# Patient Record
Sex: Female | Born: 1970 | Race: White | Hispanic: No | Marital: Married | Smoking: Current every day smoker
Health system: Southern US, Community
[De-identification: ages and names within clinical notes are randomized; demographics above are authoritative.]

## PROBLEM LIST (undated history)

## (undated) DIAGNOSIS — Z803 Family history of malignant neoplasm of breast: Secondary | ICD-10-CM

## (undated) DIAGNOSIS — Z22322 Carrier or suspected carrier of Methicillin resistant Staphylococcus aureus: Secondary | ICD-10-CM

## (undated) HISTORY — DX: Carrier or suspected carrier of methicillin resistant Staphylococcus aureus: Z22.322

## (undated) HISTORY — DX: Family history of malignant neoplasm of breast: Z80.3

---

## 1998-03-04 HISTORY — PX: ECTOPIC PREGNANCY SURGERY: SHX613

## 1999-04-05 HISTORY — PX: TUBAL LIGATION: SHX77

## 2009-07-10 ENCOUNTER — Ambulatory Visit: Payer: Self-pay | Admitting: Family Medicine

## 2010-04-04 DIAGNOSIS — Z22322 Carrier or suspected carrier of Methicillin resistant Staphylococcus aureus: Secondary | ICD-10-CM

## 2010-04-04 HISTORY — DX: Carrier or suspected carrier of methicillin resistant Staphylococcus aureus: Z22.322

## 2011-05-03 ENCOUNTER — Ambulatory Visit: Payer: Self-pay | Admitting: Family Medicine

## 2013-02-06 ENCOUNTER — Ambulatory Visit: Payer: Self-pay | Admitting: Family Medicine

## 2013-02-13 ENCOUNTER — Ambulatory Visit: Payer: Self-pay | Admitting: Family Medicine

## 2013-02-14 ENCOUNTER — Encounter: Payer: Self-pay | Admitting: *Deleted

## 2013-03-07 ENCOUNTER — Ambulatory Visit (INDEPENDENT_AMBULATORY_CARE_PROVIDER_SITE_OTHER): Payer: BC Managed Care – PPO | Admitting: General Surgery

## 2013-03-07 ENCOUNTER — Encounter: Payer: Self-pay | Admitting: General Surgery

## 2013-03-07 VITALS — BP 126/74 | HR 76 | Resp 12 | Ht 64.0 in | Wt 196.0 lb

## 2013-03-07 DIAGNOSIS — R920 Mammographic microcalcification found on diagnostic imaging of breast: Secondary | ICD-10-CM

## 2013-03-07 NOTE — Patient Instructions (Addendum)
Follow up on 6 months with left breast diagnostic mammogram and office visit.  Continue self breast exams. Call office for any new breast issues or concerns.

## 2013-03-07 NOTE — Progress Notes (Signed)
Patient ID: Mary Lopez, female   DOB: 01/24/1971, 42 y.o.   MRN: [id number]  Chief Complaint  Patient presents with  . Breast Problem    HPI Mary Lopez is a 42 y.o. female  Who was referred to [place] by Dr Carlynn Purl for an abnormal mammogram. The most recent mammogram was done on 02/13/13 @ [facility].  Patient does perform regular self breast checks and her last regular mammogram was 2012.  She states she can't feel anything abnormal in the breast.    HPI  Past Medical History  Diagnosis Date  . 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 Ectopic pregnancy surgery  03/1998  . Tubal ligation  04/1999    Family History  Problem Relation Age of Onset  . Breast cancer Maternal Aunt   . Breast cancer Maternal Aunt   . Breast cancer Cousin     Social History History  Substance Use Topics  . Smoking status: Current Every Day Smoker -- 0.50 packs/day for 13 years    Types: Cigarettes  . Smokeless tobacco: Never Used  . Alcohol Use: No    Allergies  Allergen Reactions  . Penicillins     Mouth breaks out  . Sulfa Antibiotics Other (See Comments)    Muscle pain    No current outpatient prescriptions on file.   No current facility-administered medications for this visit.    Review of Systems Review of Systems  Constitutional: Negative.   Respiratory: Negative.   Cardiovascular: Negative.     Blood pressure 126/74, pulse 76, resp. rate 12, height 5\' 4"  (1.626 m), weight 196 lb (88.905 kg), last menstrual period 03/07/2013.  Physical Exam Physical Exam  Constitutional: She is oriented to person, place, and time. She appears well-developed and well-nourished.  Eyes: Conjunctivae are normal. No scleral icterus.  Neck: Neck supple.  Cardiovascular: Normal rate, regular rhythm and normal heart sounds.   Pulmonary/Chest: Effort normal and breath sounds normal. Right breast exhibits no inverted nipple, no mass, no nipple discharge, no skin  change and no tenderness. Left breast exhibits no inverted nipple, no mass, no nipple discharge, no skin change and no tenderness.  Lymphadenopathy:    She has no cervical adenopathy.    She has no axillary adenopathy.  Neurological: She is alert and oriented to person, place, and time.  Skin: Skin is warm and dry.    Data Reviewed Mammogram reviewed. There is group of 3-4 microcalcifications lateral left breast, as also some scattered calcifications elsewhere.  21mo f/u of left breast is reasonable.  Discussed fully with pt. Pt is satisfied with the plan.  Assessment    Normal breast evaluation. Abnormal mammogram.    Plan    Follow up on 6 months with left breast diagnostic mammogram and office visit.        Elly Haffey G 03/07/2013, 12:01 PM

## 2013-09-30 ENCOUNTER — Encounter: Payer: Self-pay | Admitting: General Surgery

## 2013-10-02 ENCOUNTER — Ambulatory Visit: Payer: BC Managed Care – PPO | Admitting: General Surgery

## 2013-10-14 ENCOUNTER — Ambulatory Visit: Payer: Self-pay | Admitting: Obstetrics and Gynecology

## 2013-10-14 LAB — CBC
HCT: 37 % (ref 35.0–47.0)
HGB: 12 g/dL (ref 12.0–16.0)
MCH: 30 pg (ref 26.0–34.0)
MCHC: 32.5 g/dL (ref 32.0–36.0)
MCV: 92 fL (ref 80–100)
Platelet: 281 10*3/uL (ref 150–440)
RBC: 4 10*6/uL (ref 3.80–5.20)
RDW: 14 % (ref 11.5–14.5)
WBC: 9.5 10*3/uL (ref 3.6–11.0)

## 2013-10-22 ENCOUNTER — Ambulatory Visit: Payer: Self-pay | Admitting: Obstetrics and Gynecology

## 2013-10-28 LAB — PATHOLOGY REPORT

## 2013-10-30 ENCOUNTER — Ambulatory Visit: Payer: BC Managed Care – PPO | Admitting: General Surgery

## 2013-11-26 ENCOUNTER — Encounter: Payer: Self-pay | Admitting: *Deleted

## 2014-02-03 ENCOUNTER — Encounter: Payer: Self-pay | Admitting: General Surgery

## 2014-07-26 NOTE — Op Note (Signed)
PATIENT NAME:  Mary Lopez, Mary Lopez MR#:  [id number] DATE OF BIRTH:  [birth year]  DATE OF PROCEDURE:  10/22/2013  PREOPERATIVE DIAGNOSIS: Menorrhagia.   POSTOPERATIVE DIAGNOSIS: Menorrhagia and small anterior endometrial polyp.   OPERATION PERFORMED: Hysteroscopy, dilatation and curettage, and NovaSure endometrial ablation.   PRIMARY SURGEON: Vena AustriaAndreas Brion Sossamon, MD  ANESTHESIA USED: General via LMA.  PREOPERATIVE ANTIBIOTICS: None.   DRAINS OR TUBES: None.   ESTIMATED BLOOD LOSS: Minimal.   OPERATIVE FLUIDS: 700 mL.   URINE OUTPUT: 30 mL of clear urine.   COMPLICATIONS: None.   FINDINGS: Normal cervix and endometrial cavity, other than a small 3 mm uterine anterior lower segment polyp, which was removed during the sharp curettage portion of the case. Post ablation hysteroscopy revealed good coverage of the entire cavity with sparing of the endocervix.   SPECIMENS REMOVED: Endometrial curettings.   PATIENT CONDITION FOLLOWING PROCEDURE: Stable.   PROCEDURE IN DETAIL: Risks, benefits, and alternatives of the procedure were discussed with the patient prior to proceeding to the operating room. The patient was taken to the operating room where she was placed under general anesthesia using LMA airway. She was positioned in the dorsal lithotomy position utilizing Allen stirrups, prepped and draped in the usual sterile fashion. A timeout was performed. Attention was turned to the patient's pelvis. The bladder was drained using a red rubber catheter for 30 mL of clear urine. An operative speculum was then placed visualizing the anterior lip of the cervix, which was grasped with a single-tooth tenaculum. The cervix was sequentially dilated using Pratt dilators to allow passage of the hysteroscope. Hysteroscopic evaluation of the cavity revealed a grossly normal-appearing cavity with the exception of a small 3 mm anterior lower uterine segment polyp. This was removed during sharp curettage, which was  undertaken following the hysteroscopy using a serrated small curette. A hysteroscopy of the cavity following the curettage revealed complete removal of the endometrial polyp. The uterus sounded to 9.5 cm with a cervical length of 4 cm yielding a cavity length of 5.5 cm.  The NovaSure device was placed within the endometrial cavity and deployed yielding a cavity width of 4.5 cm. The device was activated and passed cavity assessment with total burn time of 1 minute 42 seconds. Following the ablation, the NovaSure device was removed. A second look hysteroscopy revealed good coverage of the endometrial cavity with sparing of the endocervix. The single-tooth tenaculum was removed. The tenaculum sites were noted to be hemostatic as was the cervix. Sponge, needle, and instrument counts were correct x2. The patient was taken to the recovery room in stable condition.   ____________________________ Florina OuAndreas M. Bonney AidStaebler, MD ams:sb D: 10/24/2013 12:14:15 ET T: 10/24/2013 12:25:09 ET JOB#: [id number]  cc: Florina OuAndreas M. Bonney AidStaebler, MD, <Dictator> Carmel SacramentoANDREAS Cathrine MusterM Baya Lentz MD ELECTRONICALLY SIGNED 11/14/2013 11:39

## 2015-04-08 ENCOUNTER — Telehealth: Payer: Self-pay

## 2015-04-08 NOTE — Telephone Encounter (Signed)
Tried to contact this patient to see if she was interested in getting her influenza vaccine, but there was no answer. A message was left for her to give us a call when she got the chance.  

## 2015-07-31 IMAGING — MG MM DIGITAL SCREENING BILAT W/ CAD
1 series · 4 of 4 positions shown · non-contrast
Comparison: Previous Exam(s)

CLINICAL DATA: Screening.

EXAM:
DIGITAL SCREENING BILATERAL MAMMOGRAM WITH CAD

[R CC · right · 4 of 4 slices shown]
[im 1/4]
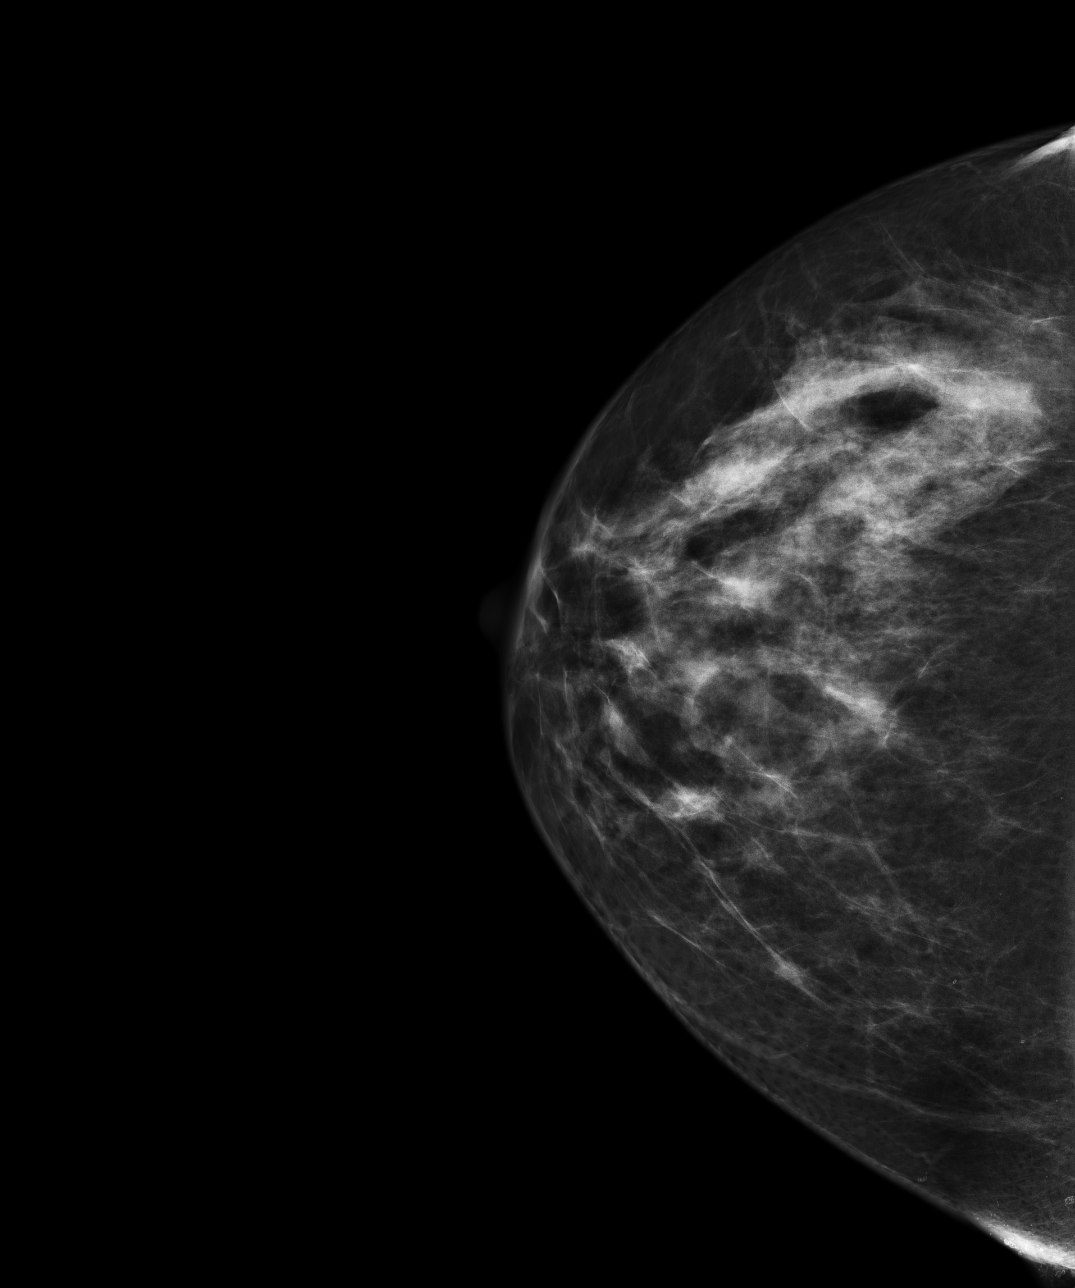
[im 2/4]
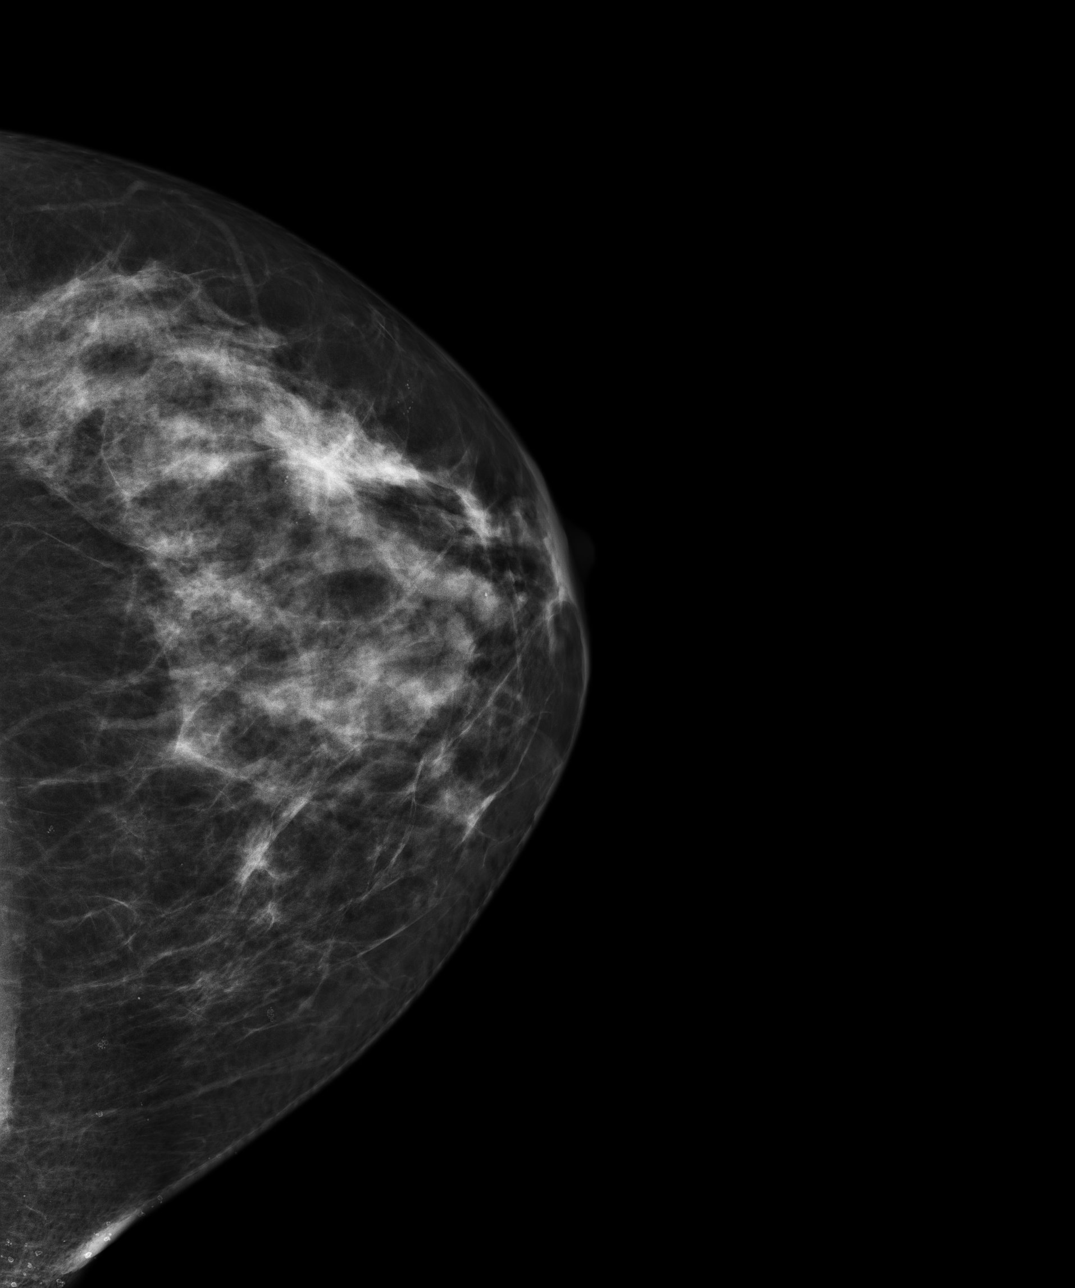
[im 3/4]
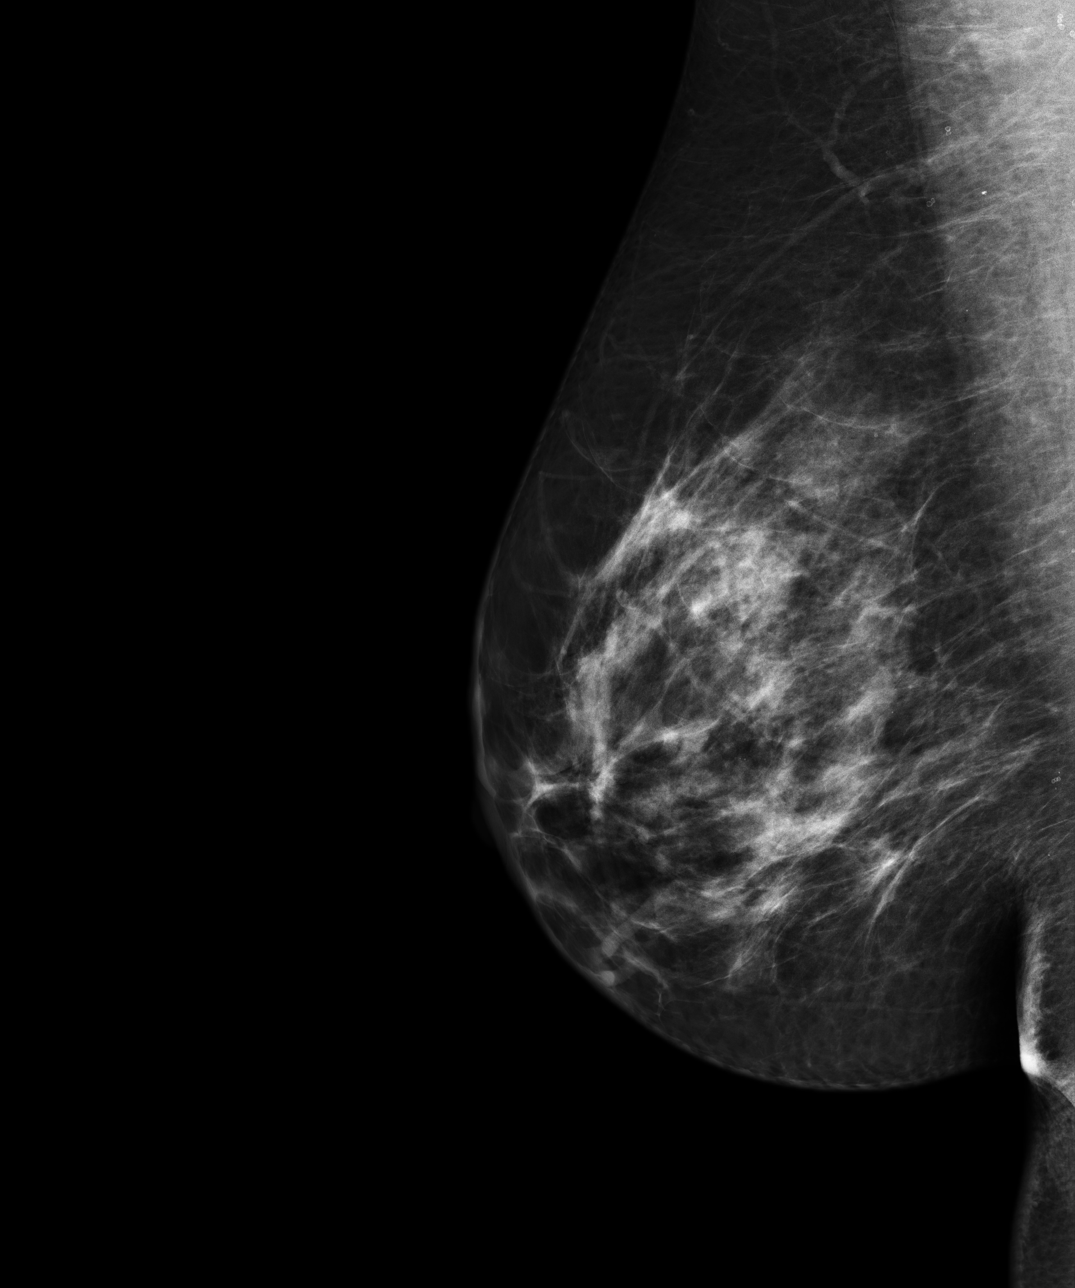
[im 4/4]
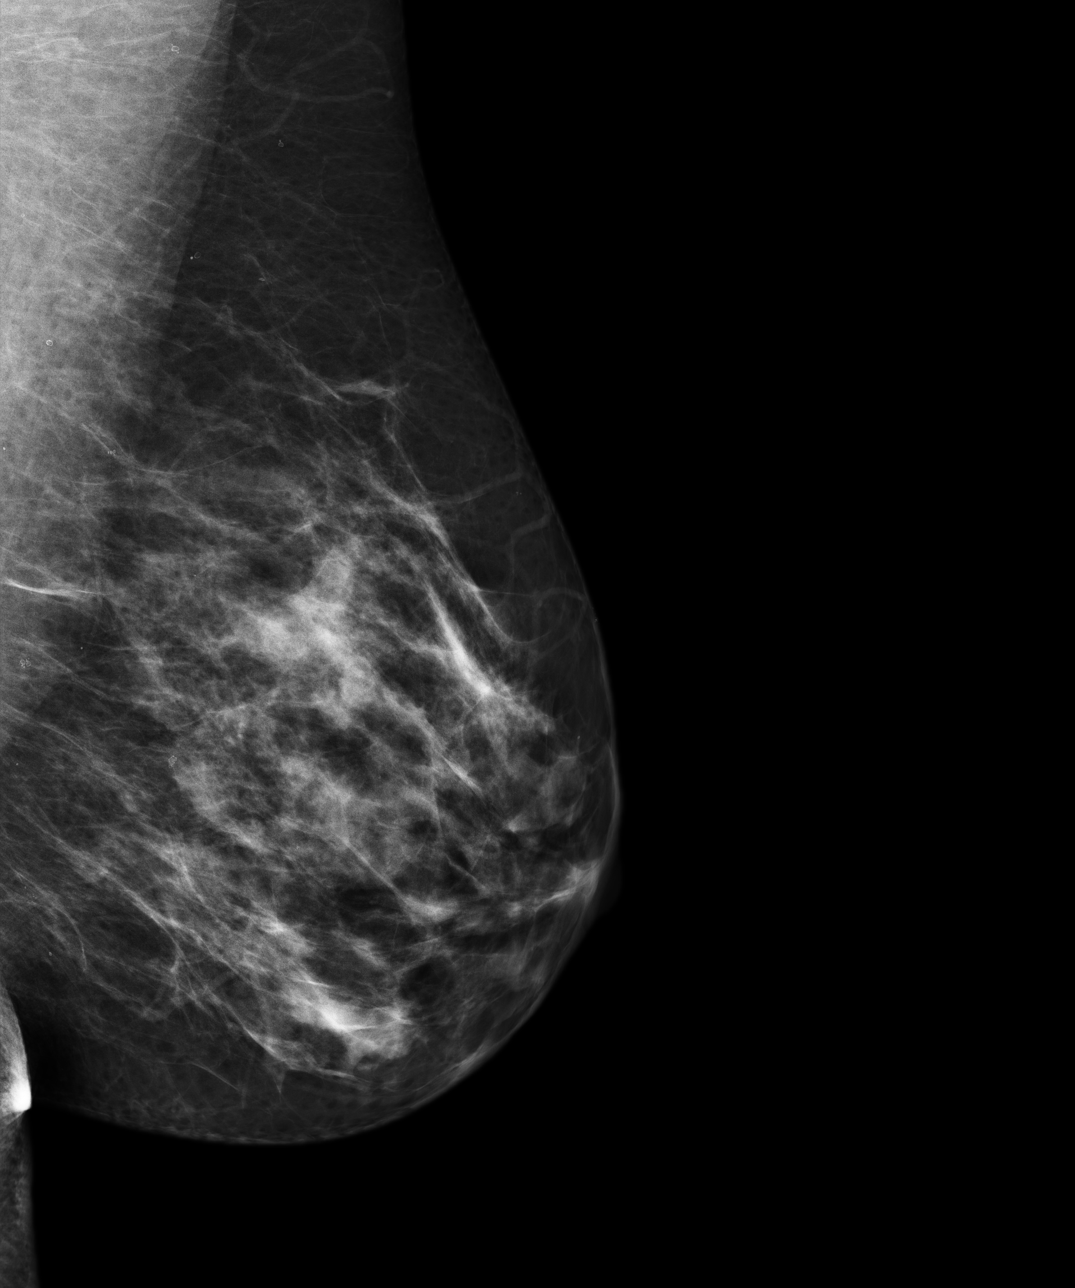

[4 of 4 positions shown; findings below may reference images not displayed]

ACR Breast Density Category c: The breasts are heterogeneously
dense, which may obscure small masses.
FINDINGS: In the left breast, calcifications warrant further evaluation with
magnified views. In the right breast, no mass or malignant type
calcifications are identified. Images were processed with CAD.
IMPRESSION: Further evaluation is suggested for calcifications in the left
breast.

RECOMMENDATION:
Diagnostic mammogram of the left breast. (Code:P4-X-JJJ)

The patient will be contacted regarding the findings, and additional
imaging will be scheduled.

BI-RADS CATEGORY  0. Incomplete. Need additional imaging evaluation
and/or prior mammograms for comparison.

## 2016-07-04 ENCOUNTER — Ambulatory Visit (INDEPENDENT_AMBULATORY_CARE_PROVIDER_SITE_OTHER): Payer: Self-pay | Admitting: Obstetrics and Gynecology

## 2016-07-04 ENCOUNTER — Encounter: Payer: Self-pay | Admitting: Obstetrics and Gynecology

## 2016-07-04 VITALS — BP 126/76 | HR 83 | Ht 64.0 in | Wt 193.0 lb

## 2016-07-04 DIAGNOSIS — Z131 Encounter for screening for diabetes mellitus: Secondary | ICD-10-CM

## 2016-07-04 DIAGNOSIS — Z13220 Encounter for screening for lipoid disorders: Secondary | ICD-10-CM

## 2016-07-04 DIAGNOSIS — Z01419 Encounter for gynecological examination (general) (routine) without abnormal findings: Secondary | ICD-10-CM

## 2016-07-04 DIAGNOSIS — Z1371 Encounter for nonprocreative screening for genetic disease carrier status: Secondary | ICD-10-CM | POA: Insufficient documentation

## 2016-07-04 DIAGNOSIS — Z1231 Encounter for screening mammogram for malignant neoplasm of breast: Secondary | ICD-10-CM

## 2016-07-04 DIAGNOSIS — F324 Major depressive disorder, single episode, in partial remission: Secondary | ICD-10-CM

## 2016-07-04 DIAGNOSIS — Z1329 Encounter for screening for other suspected endocrine disorder: Secondary | ICD-10-CM

## 2016-07-04 DIAGNOSIS — Z1239 Encounter for other screening for malignant neoplasm of breast: Secondary | ICD-10-CM

## 2016-07-04 MED ORDER — FLUOXETINE HCL 20 MG PO CAPS: 20.0000 mg | ORAL_CAPSULE | Freq: Every day | ORAL | 2 refills | Status: DC

## 2016-07-04 NOTE — Patient Instructions (Signed)
See my chart sign up info

## 2016-07-04 NOTE — Progress Notes (Signed)
Patient ID: Mary Lopez, female   DOB: 07/11/1970, 45 y.o.   MRN: [id number]     Gynecology Annual Exam  PCP: Krichna F Sowles, MD  Chief Complaint:  Chief Complaint  Patient presents with  . Gynecologic Exam    change medication  . vaginal irritation    left labia sore    History of Present Illness: Patient is a 45 y.o. G4P0013 presents for annual exam. The patient has no complaints today.   LMP: No LMP recorded. Patient has had an ablation. No bleeding since ablation   The patient is not sexually active. She currently uses None for contraception. She denies and does not dyspareunia.  The patient does perform self breast exams.  There is notable family history of breast or ovarian cancer in her family.  The patient wears seatbelts: yes.   The patient has regular exercise: not asked.    The patient denies current symptoms of depression.    Review of Systems: ROS  Past Medical History:  Past Medical History:  Diagnosis Date  . 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 ECTOPIC PREGNANCY SURGERY  03/1998  . TUBAL LIGATION  04/1999    Gynecologic History:  No LMP recorded. Patient has had an ablation. Contraception: abstinence Last Pap: Results were: NIL and HR HPV negative  Last mammogram: 1 year ago normal Obstetric History: G4P0013  Family History:  Family History  Problem Relation Age of Onset  . Breast cancer Maternal Aunt 50  . Breast cancer Maternal Aunt 60  . Breast cancer Cousin 40    Social History:  Social History   Social History  . Marital status: Married    Spouse name: N/A  . Number of children: N/A  . Years of education: N/A   Occupational History  . Not on file.   Social History Main Topics  . Smoking status: Current Every Day Smoker    Packs/day: 0.50    Years: 13.00    Types: Cigarettes  . Smokeless tobacco: Never Used  . Alcohol use No  . Drug use: No  . Sexual  activity: No   Other Topics Concern  . Not on file   Social History Narrative  . No narrative on file    Allergies:  Allergies  Allergen Reactions  . Penicillins     Mouth breaks out  . Sulfa Antibiotics Other (See Comments)    Muscle pain    Medications: Prior to Admission medications   Medication Sig Start Date End Date Taking? Authorizing Provider  escitalopram (LEXAPRO) 20 MG tablet Take 20 mg by mouth daily. 05/18/16   Historical Provider, MD    Physical Exam Vitals: Blood pressure 126/76, pulse 83, height 5' 4" (1.626 m), weight 193 lb (87.5 kg).  General: NAD HEENT: normocephalic, anicteric Thyroid: no enlargement, no palpable nodules Pulmonary: No increased work of breathing, CTAB Cardiovascular: RRR, distal pulses 2+ Breast: Breast symmetrical, no tenderness, no palpable nodules or masses, no skin or nipple retraction present, no nipple discharge.  No axillary or supraclavicular lymphadenopathy. Abdomen: NABS, soft, non-tender, non-distended.  Umbilicus without lesions.  No hepatomegaly, splenomegaly or masses palpable. No evidence of hernia  Genitourinary:  External: Normal external female genitalia.  Normal urethral meatus, normal  Bartholin's and Skene's glands.    Vagina: Normal vaginal mucosa, no evidence of prolapse.    Cervix: Grossly normal in appearance, no bleeding  Uterus: Non-enlarged, mobile, normal contour.    No CMT  Adnexa: ovaries non-enlarged, no adnexal masses  Rectal: deferred  Lymphatic: no evidence of inguinal lymphadenopathy Extremities: no edema, erythema, or tenderness Neurologic: Grossly intact Psychiatric: mood appropriate, affect full  Female chaperone present for pelvic and breast  portions of the physical exam    Assessment: 45 y.o. G4P0013 routine annual exam Plan: Problem List Items Addressed This Visit      Other   BRCA negative   Major depressive disorder with single episode, in partial remission (HCC)   Relevant  Medications   FLUoxetine (PROZAC) 20 MG capsule   Other Relevant Orders   CMP14+LP+TP+TSH+CBC/Plt    Other Visit Diagnoses    Breast cancer screening, high risk patient    -  Primary   Relevant Orders   MM DIGITAL SCREENING BILATERAL   Encounter for gynecological examination without abnormal finding       Relevant Orders   CMP14+LP+TP+TSH+CBC/Plt   Lipid screening       Relevant Orders   CMP14+LP+TP+TSH+CBC/Plt   Thyroid disorder screening       Relevant Orders   CMP14+LP+TP+TSH+CBC/Plt   Diabetes mellitus screening       Relevant Orders   CMP14+LP+TP+TSH+CBC/Plt      1) Mammogram - recommend yearly screening mammogram.  Mammogram Was ordered today - BRCA testing previously negative with TC model 10 year risk of breast cancer 2.2% and lifetime risk of 12.8%  2) STI screening was offered and declined  3) ASCCP guidelines and rational discussed.  Patient opts for every 3 years screening interval  4) Contraception - currently not sexually active.  5) Routine healthcare maintenance including cholesterol, diabetes screening discussed Ordered today   6) Switch to prozac from lexapro could not tolerate increase to 20mg  7) Re-evaluate response to prozac in 6 weeks          

## 2016-07-20 ENCOUNTER — Encounter: Payer: Self-pay | Admitting: Obstetrics and Gynecology

## 2016-07-20 ENCOUNTER — Telehealth: Payer: Self-pay

## 2016-07-20 NOTE — Telephone Encounter (Signed)
Pt is schedule for 08/03/16

## 2016-07-20 NOTE — Telephone Encounter (Signed)
Pt calling.  AMS changed her from escitalopram to prozac.  She is not doing well on prozac.  She took herself off and went back to escitalopram on Mon b/c she was having too many side effects.  AMS talked about adding something to the escitalopram at her last visit.  Would like to do that if possible since she didn't do well on the prozac.  [phone]

## 2016-07-20 NOTE — Telephone Encounter (Signed)
Pt needs an appointment. Does not have to be today.

## 2016-07-21 ENCOUNTER — Encounter: Payer: Self-pay | Admitting: Obstetrics and Gynecology

## 2016-07-21 DIAGNOSIS — Z803 Family history of malignant neoplasm of breast: Secondary | ICD-10-CM | POA: Insufficient documentation

## 2016-07-25 ENCOUNTER — Other Ambulatory Visit: Payer: Self-pay | Admitting: Obstetrics and Gynecology

## 2016-07-25 MED ORDER — ESCITALOPRAM OXALATE 10 MG PO TABS: 10.0000 mg | ORAL_TABLET | Freq: Every day | ORAL | 3 refills | Status: DC

## 2016-07-25 MED ORDER — BUSPIRONE HCL 10 MG PO TABS: 10.0000 mg | ORAL_TABLET | Freq: Two times a day (BID) | ORAL | 2 refills | Status: AC

## 2016-08-03 ENCOUNTER — Ambulatory Visit: Payer: Self-pay | Admitting: Obstetrics and Gynecology

## 2016-08-17 ENCOUNTER — Ambulatory Visit: Payer: Self-pay | Admitting: Obstetrics and Gynecology

## 2016-09-04 ENCOUNTER — Encounter: Payer: Self-pay | Admitting: Obstetrics and Gynecology

## 2016-09-06 ENCOUNTER — Other Ambulatory Visit: Payer: Self-pay | Admitting: Obstetrics and Gynecology

## 2016-09-06 MED ORDER — ESCITALOPRAM OXALATE 10 MG PO TABS: 10.0000 mg | ORAL_TABLET | Freq: Every day | ORAL | 11 refills | Status: AC

## 2016-09-07 ENCOUNTER — Ambulatory Visit: Payer: Self-pay | Admitting: Obstetrics and Gynecology

## 2016-09-14 ENCOUNTER — Telehealth: Payer: Self-pay | Admitting: Obstetrics and Gynecology

## 2016-09-14 ENCOUNTER — Other Ambulatory Visit: Payer: Self-pay | Admitting: Obstetrics and Gynecology

## 2016-09-14 MED ORDER — INSULIN PEN NEEDLE 32G X 6 MM MISC: 1.0000 [IU] | Freq: Every day | 11 refills | Status: AC

## 2016-09-14 MED ORDER — LIRAGLUTIDE -WEIGHT MANAGEMENT 18 MG/3ML ~~LOC~~ SOPN: 3.0000 mg | PEN_INJECTOR | Freq: Every day | SUBCUTANEOUS | 11 refills | Status: AC

## 2016-09-14 NOTE — Telephone Encounter (Signed)
AMS, can you shed light on this. [facility] and I do not see this medication on her med. List in Epic.

## 2016-09-14 NOTE — Telephone Encounter (Signed)
Rx has been sent may need weight documented she has been on it for the past year.  Since [facility] wasn't updating the medication list it was not available for me to choose so I think I handwrote it but she has a [facility] note where she followed up and had the 5% weight reduction required and was approved to continue

## 2016-09-14 NOTE — Telephone Encounter (Signed)
Mary Lopez is calling about pt prescription for Saxenda  needing to know about prior authorization please call # [phone] ref# [id number]

## 2016-09-15 NOTE — Telephone Encounter (Signed)
Pt states she currently has no active insurance. She has not been on Saxenda since January. I will notify pharmacy.  3:40pm [facility] Sax[facility]

## 2016-09-15 NOTE — Telephone Encounter (Signed)
Message left for patient to call me back. In our system [employer] has been terminated but she has Medicaid. I need clarification from patient.

## 2017-09-30 ENCOUNTER — Other Ambulatory Visit: Payer: Self-pay | Admitting: Obstetrics and Gynecology
